# Patient Record
Sex: Male | Born: 2000 | Race: White | Hispanic: No | Marital: Single | Smoking: Never smoker
Health system: Southern US, Community
[De-identification: ages and names within clinical notes are randomized; demographics above are authoritative.]

## PROBLEM LIST (undated history)

## (undated) DIAGNOSIS — F909 Attention-deficit hyperactivity disorder, unspecified type: Secondary | ICD-10-CM

## (undated) HISTORY — PX: APPENDECTOMY: SHX54

---
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
## 2018-08-08 DIAGNOSIS — B356 Tinea cruris: Secondary | ICD-10-CM | POA: Diagnosis not present

## 2018-08-30 DIAGNOSIS — F411 Generalized anxiety disorder: Secondary | ICD-10-CM | POA: Diagnosis not present

## 2018-08-30 DIAGNOSIS — F902 Attention-deficit hyperactivity disorder, combined type: Secondary | ICD-10-CM | POA: Diagnosis not present

## 2018-08-30 DIAGNOSIS — Z79899 Other long term (current) drug therapy: Secondary | ICD-10-CM | POA: Diagnosis not present

## 2019-01-17 DIAGNOSIS — Z20828 Contact with and (suspected) exposure to other viral communicable diseases: Secondary | ICD-10-CM | POA: Diagnosis not present

## 2019-03-10 DIAGNOSIS — M79672 Pain in left foot: Secondary | ICD-10-CM | POA: Diagnosis not present

## 2019-09-10 DIAGNOSIS — Z20828 Contact with and (suspected) exposure to other viral communicable diseases: Secondary | ICD-10-CM | POA: Diagnosis not present

## 2020-01-02 DIAGNOSIS — M25571 Pain in right ankle and joints of right foot: Secondary | ICD-10-CM | POA: Diagnosis not present

## 2020-05-23 DIAGNOSIS — Z202 Contact with and (suspected) exposure to infections with a predominantly sexual mode of transmission: Secondary | ICD-10-CM | POA: Diagnosis not present

## 2020-05-23 DIAGNOSIS — A749 Chlamydial infection, unspecified: Secondary | ICD-10-CM | POA: Diagnosis not present

## 2020-06-07 DIAGNOSIS — A749 Chlamydial infection, unspecified: Secondary | ICD-10-CM | POA: Diagnosis not present

## 2020-11-29 DIAGNOSIS — F902 Attention-deficit hyperactivity disorder, combined type: Secondary | ICD-10-CM | POA: Diagnosis not present

## 2020-12-25 DIAGNOSIS — F908 Attention-deficit hyperactivity disorder, other type: Secondary | ICD-10-CM | POA: Diagnosis not present

## 2021-02-19 DIAGNOSIS — F902 Attention-deficit hyperactivity disorder, combined type: Secondary | ICD-10-CM | POA: Diagnosis not present

## 2021-03-28 DIAGNOSIS — F902 Attention-deficit hyperactivity disorder, combined type: Secondary | ICD-10-CM | POA: Diagnosis not present

## 2021-04-29 DIAGNOSIS — R110 Nausea: Secondary | ICD-10-CM | POA: Diagnosis not present

## 2021-04-29 DIAGNOSIS — W228XXA Striking against or struck by other objects, initial encounter: Secondary | ICD-10-CM | POA: Diagnosis not present

## 2021-04-29 DIAGNOSIS — S060X0A Concussion without loss of consciousness, initial encounter: Secondary | ICD-10-CM | POA: Diagnosis not present

## 2021-04-29 DIAGNOSIS — Y999 Unspecified external cause status: Secondary | ICD-10-CM | POA: Diagnosis not present

## 2021-07-02 DIAGNOSIS — F902 Attention-deficit hyperactivity disorder, combined type: Secondary | ICD-10-CM | POA: Diagnosis not present

## 2021-08-08 DIAGNOSIS — Z202 Contact with and (suspected) exposure to infections with a predominantly sexual mode of transmission: Secondary | ICD-10-CM | POA: Diagnosis not present

## 2021-08-08 DIAGNOSIS — M25552 Pain in left hip: Secondary | ICD-10-CM | POA: Diagnosis not present

## 2021-10-01 DIAGNOSIS — F902 Attention-deficit hyperactivity disorder, combined type: Secondary | ICD-10-CM | POA: Diagnosis not present

## 2023-06-01 ENCOUNTER — Other Ambulatory Visit: Payer: Self-pay

## 2023-06-01 ENCOUNTER — Emergency Department: Payer: Worker's Compensation

## 2023-06-01 ENCOUNTER — Emergency Department
Admission: EM | Admit: 2023-06-01 | Discharge: 2023-06-01 | Disposition: A | Discharge status: Home / Self Care | Payer: Worker's Compensation | Attending: Student in an Organized Health Care Education/Training Program | Admitting: Student in an Organized Health Care Education/Training Program

## 2023-06-01 DIAGNOSIS — W3189XA Contact with other specified machinery, initial encounter: Secondary | ICD-10-CM | POA: Insufficient documentation

## 2023-06-01 DIAGNOSIS — Y990 Civilian activity done for income or pay: Secondary | ICD-10-CM | POA: Diagnosis not present

## 2023-06-01 DIAGNOSIS — S6991XA Unspecified injury of right wrist, hand and finger(s), initial encounter: Secondary | ICD-10-CM | POA: Insufficient documentation

## 2023-06-01 MED ORDER — CEPHALEXIN 500 MG PO CAPS: 500.0000 mg | ORAL_CAPSULE | Freq: Three times a day (TID) | ORAL | 0 refills | Status: AC

## 2023-06-01 MED ORDER — CEPHALEXIN 500 MG PO CAPS: 500.0000 mg | ORAL_CAPSULE | Freq: Once | ORAL | Status: DC

## 2023-06-01 MED ORDER — BACITRACIN ZINC 500 UNIT/GM EX OINT: TOPICAL_OINTMENT | Freq: Once | CUTANEOUS | Status: DC

## 2023-06-01 NOTE — ED Triage Notes (Addendum)
Pt to ed from [employer]) for a hand injury. Pt was working with a machine and got injured and was sent here with a packet of paperwork. There is no approval letter from his supervisor or confirmation whether or not he needs a drug screen. Pt is caox4, in no acute distress and ambulatory in triage. Pt has abrasion to the knuckles on the right hand.

## 2023-06-01 NOTE — ED Provider Notes (Signed)
[facility] Provider Note    Event Date/Time   First MD Initiated Contact with Patient 06/01/23 1825     (approximate)   History   Hand Injury (RIGHT - Workers Comp)   HPI  Jason Calhoun is a 22 y.o. male [name] to the ER for evaluation of right hand injury.  He is cut it on a spindle at work and then used a air compressor nozzle to blow off dust.  When he did this he saw the back of his hand swelled up with air.  It has since then resolved and he is not having any significant discomfort did clean off the wound and put Neosporin on it but came to the ER per work recommendation.  He has got full range of motion of the right hand.  Tetanus is up-to-date.     Physical Exam   Triage Vital Signs: ED Triage Vitals  Encounter Vitals Group     BP 06/01/23 1655 139/89     Systolic BP Percentile --      Diastolic BP Percentile --      Pulse Rate 06/01/23 1655 78     Resp 06/01/23 1655 16     Temp 06/01/23 1655 97.9 F (36.6 C)     Temp Source 06/01/23 1655 Oral     SpO2 06/01/23 1655 98 %     Weight --      Height 06/01/23 1656 6' (1.829 m)     Head Circumference --      Peak Flow --      Pain Score 06/01/23 1656 1     Pain Loc --      Pain Education --      Exclude from Growth Chart --     Most recent vital signs: Vitals:   06/01/23 1655  BP: 139/89  Pulse: 78  Resp: 16  Temp: 97.9 F (36.6 C)  SpO2: 98%     Constitutional: Alert  Eyes: Conjunctivae are normal.  Head: Atraumatic. Nose: No congestion/rhinnorhea. Mouth/Throat: Mucous membranes are moist.   Neck: Painless ROM.  Cardiovascular:   Good peripheral circulation. Respiratory: Normal respiratory effort.  No retractions.  Gastrointestinal: Soft and nontender.  Musculoskeletal: Well-approximated small 2 to 3 mm wound over the dorsum of the third PIP joint and some associated abrasion.  Hemostatic.  Hand is soft nontender no crepitus.  Full painless range of motion good grip  strength extensor and flexor mechanisms are intact.  Neurovascular intact. Neurologic:  MAE spontaneously. No gross focal neurologic deficits are appreciated.  Skin:  Skin is warm, dry and intact. No rash noted. Psychiatric: Mood and affect are normal. Speech and behavior are normal.    ED Results / Procedures / Treatments   Labs (all labs ordered are listed, but only abnormal results are displayed) Labs Reviewed - No data to display   EKG     RADIOLOGY    PROCEDURES:  Critical Care performed:   Procedures   MEDICATIONS ORDERED IN ED: Medications  cephALEXin (KEFLEX) capsule 500 mg (has no administration in time range)  bacitracin ointment (has no administration in time range)     IMPRESSION / MDM / ASSESSMENT AND PLAN / ED COURSE  I reviewed the triage vital signs and the nursing notes.                              Differential diagnosis includes, but is not limited  to, laceration, fracture, foreign body, contusion  Patient presented to the ER for evaluation of symptoms as described above he is well-appearing exam is reassuring.  No crepitus on exam.  No sign of ligamentous injury flexor and extensor mechanisms are intact.  The wound has already enclosed is well-approximated.  Not consistent with injury to joint space.  Do not feel that diagnostic imaging is clinically indicated based on reassuring exam.  I will put on prophylactic antibiotics.  Patient is agreeable to plan.  Discussed signs and symptoms for which she should return to the ER.       FINAL CLINICAL IMPRESSION(S) / ED DIAGNOSES   Final diagnoses:  Injury of right hand, initial encounter     Rx / DC Orders   ED Discharge Orders          Ordered    cephALEXin (KEFLEX) 500 MG capsule  3 times daily        06/01/23 1840             Note:  This document was prepared using Dragon voice recognition software and may include unintentional dictation errors.    Willy Eddy,  MD 06/01/23 [phone]
